# Patient Record
Sex: Male | Born: 1995
Health system: Southern US, Community
[De-identification: ages and names within clinical notes are randomized; demographics above are authoritative.]

## PROBLEM LIST (undated history)

## (undated) DIAGNOSIS — H6980 Other specified disorders of Eustachian tube, unspecified ear: Secondary | ICD-10-CM

## (undated) HISTORY — DX: Other specified disorders of Eustachian tube, unspecified ear: H69.80

---

## 2017-01-24 DIAGNOSIS — H0014 Chalazion left upper eyelid: Secondary | ICD-10-CM | POA: Diagnosis not present

## 2017-04-19 DIAGNOSIS — H01009 Unspecified blepharitis unspecified eye, unspecified eyelid: Secondary | ICD-10-CM | POA: Diagnosis not present

## 2017-04-19 DIAGNOSIS — H0014 Chalazion left upper eyelid: Secondary | ICD-10-CM | POA: Diagnosis not present

## 2017-05-08 DIAGNOSIS — H0014 Chalazion left upper eyelid: Secondary | ICD-10-CM | POA: Diagnosis not present

## 2017-05-08 DIAGNOSIS — H0011 Chalazion right upper eyelid: Secondary | ICD-10-CM | POA: Diagnosis not present

## 2017-05-08 MED FILL — NEO/POLY/DEXAMET EYE OINT: 3.5-10000-0 | 7 days supply | Qty: 4 | Fill #0

## 2017-07-04 ENCOUNTER — Encounter (HOSPITAL_COMMUNITY): Payer: Self-pay | Admitting: Emergency Medicine

## 2017-07-04 ENCOUNTER — Ambulatory Visit (HOSPITAL_COMMUNITY)
Admission: EM | Admit: 2017-07-04 | Discharge: 2017-07-04 | Disposition: A | Discharge status: Home / Self Care | Payer: 59 | Attending: Family Medicine | Admitting: Family Medicine

## 2017-07-04 DIAGNOSIS — J029 Acute pharyngitis, unspecified: Secondary | ICD-10-CM

## 2017-07-04 DIAGNOSIS — J039 Acute tonsillitis, unspecified: Secondary | ICD-10-CM | POA: Diagnosis not present

## 2017-07-04 DIAGNOSIS — B9689 Other specified bacterial agents as the cause of diseases classified elsewhere: Secondary | ICD-10-CM | POA: Diagnosis not present

## 2017-07-04 DIAGNOSIS — B951 Streptococcus, group B, as the cause of diseases classified elsewhere: Secondary | ICD-10-CM | POA: Insufficient documentation

## 2017-07-04 LAB — POCT INFECTIOUS MONO SCREEN: MONO SCREEN: NEGATIVE

## 2017-07-04 LAB — POCT RAPID STREP A: Streptococcus, Group A Screen (Direct): NEGATIVE

## 2017-07-04 MED ORDER — FLUTICASONE PROPIONATE 50 MCG/ACT NA SUSP: 2.0000 | Freq: Every day | NASAL | 0 refills | Status: DC

## 2017-07-04 MED ORDER — PENICILLIN V POTASSIUM 500 MG PO TABS: 500.0000 mg | ORAL_TABLET | Freq: Two times a day (BID) | ORAL | 0 refills | Status: AC

## 2017-07-04 MED ORDER — CETIRIZINE-PSEUDOEPHEDRINE ER 5-120 MG PO TB12: 1.0000 | ORAL_TABLET | Freq: Every day | ORAL | 0 refills | Status: DC

## 2017-07-04 MED ORDER — IBUPROFEN 800 MG PO TABS
ORAL_TABLET | ORAL | Status: AC
  Filled 2017-07-04: qty 1

## 2017-07-04 MED ORDER — IBUPROFEN 800 MG PO TABS
800.0000 mg | ORAL_TABLET | Freq: Once | ORAL | Status: AC
  Administered 2017-07-04: 800 mg via ORAL

## 2017-07-04 MED FILL — PENICILLIN VK 500 MG TABLET: 500 | 10 days supply | Qty: 20 | Fill #0

## 2017-07-04 MED FILL — FLUTICASONE PROP 50 MCG SPR: 50 | 30 days supply | Qty: 16 | Fill #0

## 2017-07-04 NOTE — ED Provider Notes (Signed)
[facility]    CSN: [id number] Arrival date & time: 07/04/17  1054     History   Chief Complaint Chief Complaint  Patient presents with  . Sore Throat    HPI Samuel Mora is a 21 y.o. male.   21 year old male with history of recurrent tonsillitis comes in for 5 day history of sore throat. Has also had left ear pain. Dysphagia without trouble breathing. Had some photophobia with head pressure. Intermittent dry cough. Nasal congestion with rhinorrhea. Subjective fever with chills. Has tried chloraseptic spray, tea, home remedies with some relief. Works in a dusty environment and made symptoms worse. Denies chest pain, shortness of breath. Denies abdominal pain, rashes, nausea, vomiting. Deneis sick conatct. Former smoker, quit last year, 4 years 1 cigar a day. Denies history of seasonal allergies, mono.       History reviewed. No pertinent past medical history.  There are no active problems to display for this patient.   History reviewed. No pertinent surgical history.     Home Medications    Prior to Admission medications   Medication Sig Start Date End Date Taking? Authorizing Provider  cetirizine-pseudoephedrine (ZYRTEC-D) 5-120 MG tablet Take 1 tablet by mouth daily. 07/04/17   Cathie Hoops, Dreyah Montrose V, PA-C  fluticasone (FLONASE) 50 MCG/ACT nasal spray Place 2 sprays into both nostrils daily. 07/04/17   Cathie Hoops, Akeema Broder V, PA-C  penicillin v potassium (VEETID) 500 MG tablet Take 1 tablet (500 mg total) by mouth 2 (two) times daily. 07/04/17 07/14/17  Belinda Fisher, PA-C    Family History No family history on file.  Social History Social History  Substance Use Topics  . Smoking status: Former Games developer  . Smokeless tobacco: Never Used  . Alcohol use Yes     Allergies   Patient has no known allergies.   Review of Systems Review of Systems  Reason unable to perform ROS: See HPI as above.     Physical Exam Triage Vital Signs ED Triage Vitals  Enc Vitals Group   BP 07/04/17 1124 125/84     Pulse Rate 07/04/17 1124 96     Resp 07/04/17 1124 16     Temp 07/04/17 1124 100.1 F (37.8 C)     Temp Source 07/04/17 1124 Oral     SpO2 07/04/17 1124 99 %     Weight 07/04/17 1122 120 lb (54.4 kg)     Height 07/04/17 1122 5\' 8"  (1.727 m)     Head Circumference --      Peak Flow --      Pain Score 07/04/17 1122 7     Pain Loc --      Pain Edu? --      Excl. in GC? --    No data found.   Updated Vital Signs BP 125/84   Pulse 96   Temp 100.1 F (37.8 C) (Oral)   Resp 16   Ht 5\' 8"  (1.727 m)   Wt 120 lb (54.4 kg)   SpO2 99%   BMI 18.25 kg/m   Physical Exam  Constitutional: He is oriented to person, place, and time. He appears well-developed and well-nourished. No distress.  HENT:  Head: Normocephalic and atraumatic.  Right Ear: External ear and ear canal normal. Tympanic membrane is erythematous. Tympanic membrane is not bulging.  Left Ear: External ear and ear canal normal. Tympanic membrane is erythematous. Tympanic membrane is not bulging.  Nose: Mucosal edema and rhinorrhea present. Right sinus exhibits no maxillary sinus  tenderness and no frontal sinus tenderness. Left sinus exhibits no maxillary sinus tenderness and no frontal sinus tenderness.  Mouth/Throat: Uvula is midline and mucous membranes are normal. Posterior oropharyngeal erythema present. Tonsils are 2+ on the right. Tonsils are 2+ on the left. Tonsillar exudate (bilateral).  Eyes: Pupils are equal, round, and reactive to light. Conjunctivae are normal.  Neck: Normal range of motion. Neck supple.  Cardiovascular: Normal rate, regular rhythm and normal heart sounds.  Exam reveals no gallop and no friction rub.   No murmur heard. Pulmonary/Chest: Effort normal and breath sounds normal. He has no decreased breath sounds. He has no wheezes. He has no rhonchi. He has no rales.  Lymphadenopathy:    He has no cervical adenopathy.  Neurological: He is alert and oriented to person,  place, and time.  Skin: Skin is warm and dry.  Psychiatric: He has a normal mood and affect. His behavior is normal. Judgment normal.     UC Treatments / Results  Labs (all labs ordered are listed, but only abnormal results are displayed) Labs Reviewed  CULTURE, GROUP A STREP [facility]  POCT RAPID STREP A  POCT INFECTIOUS MONO SCREEN    EKG  EKG Interpretation None       Radiology No results found.  Procedures Procedures (including critical care time)  Medications Ordered in UC Medications  ibuprofen (ADVIL,MOTRIN) tablet 800 mg (800 mg Oral Given 07/04/17 1130)     Initial Impression / Assessment and Plan / UC Course  I have reviewed the triage vital signs and the nursing notes.  Pertinent labs & imaging results that were available during my care of the patient were reviewed by me and considered in my medical decision making (see chart for details).    Rapid strep and mono negative. Given history and exam, will treat for tonsillitis. Start penicillin as directed. Symptomatic treatment as needed. Return precautions given.   Final Clinical Impressions(s) / UC Diagnoses   Final diagnoses:  Tonsillitis    New Prescriptions Discharge Medication List as of 07/04/2017 12:30 PM    START taking these medications   Details  cetirizine-pseudoephedrine (ZYRTEC-D) 5-120 MG tablet Take 1 tablet by mouth daily., Starting Wed 07/04/2017, Normal    fluticasone (FLONASE) 50 MCG/ACT nasal spray Place 2 sprays into both nostrils daily., Starting Wed 07/04/2017, Normal    penicillin v potassium (VEETID) 500 MG tablet Take 1 tablet (500 mg total) by mouth 2 (two) times daily., Starting Wed 07/04/2017, Until Sat 07/14/2017, Normal          Linward HeadlandYu, Atina Feeley V, PA-C 07/04/17 1322

## 2017-07-04 NOTE — ED Triage Notes (Signed)
PT reports sore throat that started Friday. PT reports recurrent tonsilitis. PT reports fever of 100.0 last night. Also reports left ear pain.

## 2017-07-04 NOTE — Discharge Instructions (Addendum)
Rapid strep negative. Will treat for tonsillitis given history and exam. Start penicillin as directed. Flonase and/or Zyrtec-D for nasal congestion. You can use over the counter nasal saline rinse such as neti pot for nasal congestion. Monitor for any worsening of symptoms, swelling of the throat, trouble breathing, trouble swallowing, follow up for reevaluation.  For sore throat try using a honey-based tea. Use 3 teaspoons of honey with juice squeezed from half lemon. Place shaved pieces of ginger into 1/2-1 cup of water and warm over stove top. Then mix the ingredients and repeat every 4 hours as needed.

## 2017-07-06 LAB — CULTURE, GROUP A STREP (THRC)

## 2017-07-30 DIAGNOSIS — H5213 Myopia, bilateral: Secondary | ICD-10-CM | POA: Diagnosis not present

## 2017-07-30 DIAGNOSIS — H52223 Regular astigmatism, bilateral: Secondary | ICD-10-CM | POA: Diagnosis not present

## 2017-08-01 ENCOUNTER — Telehealth: Payer: 59 | Admitting: Family

## 2017-08-01 DIAGNOSIS — J029 Acute pharyngitis, unspecified: Secondary | ICD-10-CM

## 2017-08-01 MED ORDER — FLUTICASONE PROPIONATE 50 MCG/ACT NA SUSP: 2.0000 | Freq: Every day | NASAL | 6 refills | Status: DC

## 2017-08-01 NOTE — Progress Notes (Signed)
We are sorry that you are not feeling well.  Here is how we plan to help!  Based on what you have shared with me it looks like you have acute pharyngitis.  This is an infection most likely caused by a virus  You may use an oral decongestant such as Mucinex D or if you have glaucoma or high blood pressure use plain Mucinex. Saline nasal spray help and can safely be used as often as needed for congestion, I have prescribed: Fluticasone nasal spray two sprays in each nostril twice a day   Providers prescribe antibiotics to treat infections caused by bacteria. Antibiotics are very powerful in treating bacterial infections when they are used properly. To maintain their effectiveness, they should be used only when necessary. Overuse of antibiotics has resulted in the development of superbugs that are resistant to treatment!    After careful review of your answers, I would not recommend an antibiotic for your condition.  Antibiotics are not effective against viruses and therefore should not be used to treat them. Common examples of infections caused by viruses include colds and flu    Some authorities believe that zinc sprays or the use of Echinacea may shorten the course of your symptoms.  Home Care:  Only take medications as instructed by your medical team.  Complete the entire course of an antibiotic.  Do not take these medications with alcohol.  A steam or ultrasonic humidifier can help congestion.  You can place a towel over your head and breathe in the steam from hot water coming from a faucet.  Avoid close contacts especially the very young and the elderly.  Cover your mouth when you cough or sneeze.  Always remember to wash your hands.  Get Help Right Away If:  You develop worsening fever or sinus pain.  You develop a severe head ache or visual changes.  Your symptoms persist after you have completed your treatment plan.  Make sure you  Understand these instructions.  Will  watch your condition.  Will get help right away if you are not doing well or get worse.  Your e-visit answers were reviewed by a board certified advanced clinical practitioner to complete your personal care plan.  Depending on the condition, your plan could have included both over the counter or prescription medications.  If there is a problem please reply  once you have received a response from your provider.  Your safety is important to us.  If you have drug allergies check your prescription carefully.    You can use MyChart to ask questions about today's visit, request a non-urgent call back, or ask for a work or school excuse for 24 hours related to this e-Visit. If it has been greater than 24 hours you will need to follow up with your provider, or enter a new e-Visit to address those concerns.  You will get an e-mail in the next two days asking about your experience.  I hope that your e-visit has been valuable and will speed your recovery. Thank you for using e-visits.

## 2018-01-31 ENCOUNTER — Encounter: Payer: Self-pay | Admitting: Urgent Care

## 2018-01-31 ENCOUNTER — Ambulatory Visit (INDEPENDENT_AMBULATORY_CARE_PROVIDER_SITE_OTHER): Payer: 59 | Admitting: Urgent Care

## 2018-01-31 ENCOUNTER — Ambulatory Visit (INDEPENDENT_AMBULATORY_CARE_PROVIDER_SITE_OTHER): Payer: 59

## 2018-01-31 VITALS — BP 106/68 | HR 98 | Temp 97.5°F | Resp 16 | Ht 67.5 in | Wt 119.0 lb

## 2018-01-31 DIAGNOSIS — M25552 Pain in left hip: Secondary | ICD-10-CM | POA: Diagnosis not present

## 2018-01-31 DIAGNOSIS — R070 Pain in throat: Secondary | ICD-10-CM | POA: Diagnosis not present

## 2018-01-31 DIAGNOSIS — H938X3 Other specified disorders of ear, bilateral: Secondary | ICD-10-CM

## 2018-01-31 DIAGNOSIS — M25551 Pain in right hip: Secondary | ICD-10-CM

## 2018-01-31 DIAGNOSIS — H6982 Other specified disorders of Eustachian tube, left ear: Secondary | ICD-10-CM

## 2018-01-31 MED ORDER — FLUTICASONE PROPIONATE 50 MCG/ACT NA SUSP: 2.0000 | Freq: Every day | NASAL | 11 refills | Status: AC

## 2018-01-31 MED ORDER — PSEUDOEPHEDRINE HCL ER 120 MG PO TB12: 120.0000 mg | ORAL_TABLET | Freq: Two times a day (BID) | ORAL | 3 refills | Status: AC

## 2018-01-31 MED ORDER — CETIRIZINE HCL 10 MG PO TABS: 10.0000 mg | ORAL_TABLET | Freq: Every day | ORAL | 11 refills | Status: AC

## 2018-01-31 MED FILL — FLUTICASONE PROP 50 MCG SPR: 50 | 30 days supply | Qty: 16 | Fill #0

## 2018-01-31 NOTE — Patient Instructions (Addendum)
Hydrate well with at least 2 liters (1 gallon) of water daily. Use Zyrtec and Flonase every day. Use Sudafed for the next 1-2 weeks 1-2 times daily as needed.    Eustachian Tube Dysfunction The eustachian tube connects the middle ear to the back of the nose. It regulates air pressure in the middle ear by allowing air to move between the ear and nose. It also helps to drain fluid from the middle ear space. When the eustachian tube does not function properly, air pressure, fluid, or both can build up in the middle ear. Eustachian tube dysfunction can affect one or both ears. What are the causes? This condition happens when the eustachian tube becomes blocked or cannot open normally. This may result from:  Ear infections.  Colds and other upper respiratory infections.  Allergies.  Irritation, such as from cigarette smoke or acid from the stomach coming up into the esophagus (gastroesophageal reflux).  Sudden changes in air pressure, such as from descending in an airplane.  Abnormal growths in the nose or throat, such as nasal polyps, tumors, or enlarged tissue at the back of the throat (adenoids).  What increases the risk? This condition may be more likely to develop in people who smoke and people who are overweight. Eustachian tube dysfunction may also be more likely to develop in children, especially children who have:  Certain birth defects of the mouth, such as cleft palate.  Large tonsils and adenoids.  What are the signs or symptoms? Symptoms of this condition may include:  A feeling of fullness in the ear.  Ear pain.  Clicking or popping noises in the ear.  Ringing in the ear.  Hearing loss.  Loss of balance.  Symptoms may get worse when the air pressure around you changes, such as when you travel to an area of high elevation or fly on an airplane. How is this diagnosed? This condition may be diagnosed based on:  Your symptoms.  A physical exam of your ear, nose,  and throat.  Tests, such as those that measure: ? The movement of your eardrum (tympanogram). ? Your hearing (audiometry).  How is this treated? Treatment depends on the cause and severity of your condition. If your symptoms are mild, you may be able to relieve your symptoms by moving air into ("popping") your ears. If you have symptoms of fluid in your ears, treatment may include:  Decongestants.  Antihistamines.  Nasal sprays or ear drops that contain medicines that reduce swelling (steroids).  In some cases, you may need to have a procedure to drain the fluid in your eardrum (myringotomy). In this procedure, a small tube is placed in the eardrum to:  Drain the fluid.  Restore the air in the middle ear space.  Follow these instructions at home:  Take over-the-counter and prescription medicines only as told by your health care provider.  Use techniques to help pop your ears as recommended by your health care provider. These may include: ? Chewing gum. ? Yawning. ? Frequent, forceful swallowing. ? Closing your mouth, holding your nose closed, and gently blowing as if you are trying to blow air out of your nose.  Do not do any of the following until your health care provider approves: ? Travel to high altitudes. ? Fly in airplanes. ? Work in a Estate agent or room. ? Scuba dive.  Keep your ears dry. Dry your ears completely after showering or bathing.  Do not smoke.  Keep all follow-up visits as told  by your health care provider. This is important. Contact a health care provider if:  Your symptoms do not go away after treatment.  Your symptoms come back after treatment.  You are unable to pop your ears.  You have: ? A fever. ? Pain in your ear. ? Pain in your head or neck. ? Fluid draining from your ear.  Your hearing suddenly changes.  You become very dizzy.  You lose your balance. This information is not intended to replace advice given to you by  your health care provider. Make sure you discuss any questions you have with your health care provider. Document Released: 09/24/2015 Document Revised: 02/03/2016 Document Reviewed: 09/16/2014 Elsevier Interactive Patient Education  2018 [employer].     Allergic Rhinitis, Adult Allergic rhinitis is an allergic reaction that affects the mucous membrane inside the nose. It causes sneezing, a runny or stuffy nose, and the feeling of mucus going down the back of the throat (postnasal drip). Allergic rhinitis can be mild to severe. There are two types of allergic rhinitis:  Seasonal. This type is also called hay fever. It happens only during certain seasons.  Perennial. This type can happen at any time of the year.  What are the causes? This condition happens when the body's defense system (immune system) responds to certain harmless substances called allergens as though they were germs.  Seasonal allergic rhinitis is triggered by pollen, which can come from grasses, trees, and weeds. Perennial allergic rhinitis may be caused by:  House dust mites.  Pet dander.  Mold spores.  What are the signs or symptoms? Symptoms of this condition include:  Sneezing.  Runny or stuffy nose (nasal congestion).  Postnasal drip.  Itchy nose.  Tearing of the eyes.  Trouble sleeping.  Daytime sleepiness.  How is this diagnosed? This condition may be diagnosed based on:  Your medical history.  A physical exam.  Tests to check for related conditions, such as: ? Asthma. ? Pink eye. ? Ear infection. ? Upper respiratory infection.  Tests to find out which allergens trigger your symptoms. These may include skin or blood tests.  How is this treated? There is no cure for this condition, but treatment can help control symptoms. Treatment may include:  Taking medicines that block allergy symptoms, such as antihistamines. Medicine may be given as a shot, nasal spray, or pill.  Avoiding  the allergen.  Desensitization. This treatment involves getting ongoing shots until your body becomes less sensitive to the allergen. This treatment may be done if other treatments do not help.  If taking medicine and avoiding the allergen does not work, new, stronger medicines may be prescribed.  Follow these instructions at home:  Find out what you are allergic to. Common allergens include smoke, dust, and pollen.  Avoid the things you are allergic to. These are some things you can do to help avoid allergens: ? Replace carpet with wood, tile, or vinyl flooring. Carpet can trap dander and dust. ? Do not smoke. Do not allow smoking in your home. ? Change your heating and air conditioning filter at least once a month. ? During allergy season:  Keep windows closed as much as possible.  Plan outdoor activities when pollen counts are lowest. This is usually during the evening hours.  When coming indoors, change clothing and shower before sitting on furniture or bedding.  Take over-the-counter and prescription medicines only as told by your health care provider.  Keep all follow-up visits as told by your  health care provider. This is important. Contact a health care provider if:  You have a fever.  You develop a persistent cough.  You make whistling sounds when you breathe (you wheeze).  Your symptoms interfere with your normal daily activities. Get help right away if:  You have shortness of breath. Summary  This condition can be managed by taking medicines as directed and avoiding allergens.  Contact your health care provider if you develop a persistent cough or fever.  During allergy season, keep windows closed as much as possible. This information is not intended to replace advice given to you by your health care provider. Make sure you discuss any questions you have with your health care provider. Document Released: 05/23/2001 Document Revised: 10/05/2016 Document  Reviewed: 10/05/2016 Elsevier Interactive Patient Education  2018 [employer].     IF you received an x-ray today, you will receive an invoice from [facility]. Please contact [facility] at [phone] with questions or concerns regarding your invoice.   IF you received labwork today, you will receive an invoice from [facility]. Please contact [facility] at [phone] with questions or concerns regarding your invoice.   Our billing staff will not be able to assist you with questions regarding bills from these companies.  You will be contacted with the lab results as soon as they are available. The fastest way to get your results is to activate your My Chart account. Instructions are located on the last page of this paperwork. If you have not heard from [place] regarding the results in 2 weeks, please contact this office.

## 2018-01-31 NOTE — Progress Notes (Signed)
   MRN: [id number] DOB: 01-21-1996  Subjective:   Samuel Mora is a 22 y.o. male presenting for 1 month history of left ear popping. Has had occasional ringing of his left ear, discomfort/fullness. Had associated itchy throat, neck discomfort. 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 Denies fever, drainage, throat pain, cough. Drinks 3-4 bottles of water daily. Reports popping of both hips since his teenage years. Denies hip pain, trauma, falls.  Patient does walk frequently for physical activity. Patient smokes Black & Milds multiple times a week.   Samuel Mora is not currently taking any medications.  Also has No Known Allergies.  Samuel Mora denies past medical and surgical history.   Objective:   Vitals: BP 106/68   Pulse 98   Temp (!) 97.5 F (36.4 C) (Oral)   Resp 16   Ht 5' 7.5" (1.715 m)   Wt 119 lb (54 kg)   SpO2 100%   BMI 18.36 kg/m   Physical Exam  Constitutional: He is oriented to person, place, and time. He appears well-developed and well-nourished.  HENT:  Left TM with slight effusion, right TM is clear without erythema.  Patient has mucosal edema with violaceous appearance.  Throat with postnasal drainage and cobblestone pattern.  Eyes: Right eye exhibits no discharge. Left eye exhibits no discharge. No scleral icterus.  Neck: Normal range of motion. Neck supple.  Cardiovascular: Normal rate, regular rhythm and intact distal pulses. Exam reveals no gallop and no friction rub.  No murmur heard. Pulmonary/Chest: No respiratory distress. He has no wheezes. He has no rales.  Lymphadenopathy:    He has no cervical adenopathy.  Neurological: He is alert and oriented to person, place, and time.  Skin: Skin is warm and dry.  Psychiatric: He has a normal mood and affect.    Dg Hips Bilat W Or W/o Pelvis 2v  Result Date: 01/31/2018 CLINICAL DATA:  Bilateral hip popping.  No known injury. EXAM: DG HIP (WITH OR WITHOUT PELVIS) 2V BILAT  COMPARISON:  None. FINDINGS: There is no evidence of hip fracture or dislocation. There is no evidence of arthropathy or other focal bone abnormality. IMPRESSION: Negative exam. Electronically Signed   By: Drusilla Kanner M.D.   On: 01/31/2018 14:58     Assessment and Plan :   Eustachian tube dysfunction, left  Hip discomfort, left - Plan: DG HIPS BILAT W OR W/O PELVIS 2V  Hip discomfort, right - Plan: DG HIPS BILAT W OR W/O PELVIS 2V  Ear popping, bilateral  Throat discomfort  Counseled on management of allergic rhinitis, ETD.  Patient has a history of allergies and was previously taking Flonase and Zyrtec-D.  I will have patient restart this regimen.  Return to clinic precautions discussed.  Reassured patient with normal hip x-ray.  Continue to monitor symptoms.  Wallis Bamberg, PA-C Primary Care at [facility] [phone] 01/31/2018  2:09 PM

## 2018-03-01 ENCOUNTER — Other Ambulatory Visit: Payer: Self-pay

## 2018-03-01 ENCOUNTER — Encounter: Payer: Self-pay | Admitting: Urgent Care

## 2018-03-01 ENCOUNTER — Ambulatory Visit (INDEPENDENT_AMBULATORY_CARE_PROVIDER_SITE_OTHER): Payer: 59 | Admitting: Urgent Care

## 2018-03-01 VITALS — BP 112/56 | HR 56 | Temp 97.7°F | Resp 16 | Ht 67.5 in | Wt 119.0 lb

## 2018-03-01 DIAGNOSIS — H938X3 Other specified disorders of ear, bilateral: Secondary | ICD-10-CM

## 2018-03-01 DIAGNOSIS — H6982 Other specified disorders of Eustachian tube, left ear: Secondary | ICD-10-CM | POA: Diagnosis not present

## 2018-03-01 NOTE — Progress Notes (Signed)
    MRN: [id number] DOB: 07/15/1996  Subjective:   Samuel Mora is a 22 y.o. male presenting for follow-up on eustachian tube dysfunction.  His last office visit was on 01/31/2018.  At that point patient was experiencing ear fullness, ear popping.  He was started on Zyrtec, Flonase and Sudafed.  Today, he reports dramatic improvement for management of eustachian tube dysfunction secondary to allergies from his frequent walking outdoors.  Denies fever, ear drainage, ear pain.  He is not using the allergy medications daily anymore but notes significant improvement when he gets them back on board.  Samuel Mora has a current medication list which includes the following prescription(s): cetirizine, fluticasone, and pseudoephedrine. Also has No Known Allergies.  Samuel Mora  has a past medical history of Eustachian tube dysfunction.  Denies past surgical history.  Objective:   Vitals: BP (!) 112/56   Pulse (!) 56   Temp 97.7 F (36.5 C) (Oral)   Resp 16   Ht 5' 7.5" (1.715 m)   Wt 119 lb (54 kg)   SpO2 97%   BMI 18.36 kg/m   Physical Exam  Constitutional: He is oriented to person, place, and time. He appears well-developed and well-nourished.  HENT:  Right Ear: Tympanic membrane normal.  Left Ear: Tympanic membrane normal.  Nose: No sinus tenderness.  Mouth/Throat: Oropharynx is clear and moist.  Cardiovascular: Normal rate.  Pulmonary/Chest: Effort normal.  Neurological: He is alert and oriented to person, place, and time.   Assessment and Plan :   Ear popping, bilateral  Eustachian tube dysfunction, left  Counseled on management of ETD.  Patient will follow-up as needed.  Wallis BambergMario Kerrion Kemppainen, PA-C [facility] at [facility] [phone] 03/01/2018  2:47 PM

## 2018-03-01 NOTE — Patient Instructions (Addendum)

## 2018-04-29 ENCOUNTER — Ambulatory Visit (INDEPENDENT_AMBULATORY_CARE_PROVIDER_SITE_OTHER): Payer: 59 | Admitting: Emergency Medicine

## 2018-04-29 ENCOUNTER — Encounter: Payer: Self-pay | Admitting: Emergency Medicine

## 2018-04-29 VITALS — BP 105/64 | HR 91 | Temp 98.1°F | Resp 16 | Ht 67.0 in | Wt 115.0 lb

## 2018-04-29 DIAGNOSIS — H0014 Chalazion left upper eyelid: Secondary | ICD-10-CM

## 2018-04-29 MED ORDER — CEPHALEXIN 500 MG PO CAPS: 500.0000 mg | ORAL_CAPSULE | Freq: Three times a day (TID) | ORAL | 0 refills | Status: AC

## 2018-04-29 MED ORDER — ERYTHROMYCIN 5 MG/GM OP OINT: 1.0000 "application " | TOPICAL_OINTMENT | Freq: Two times a day (BID) | OPHTHALMIC | 1 refills | Status: AC

## 2018-04-29 NOTE — Progress Notes (Signed)
Samuel Mora 22 y.o.   Chief Complaint  Patient presents with  . Eye Pain    left/ x 1wk, discharge from eye  . Ear Pain    left/ pt wanted to get it checked, feels ok    HISTORY OF PRESENT ILLNESS: This is a 22 y.o. male complaining of swelling and redness to left upper eyelid with slight discharge for 1 week.  No other significant symptoms  HPI   Prior to Admission medications   Medication Sig Start Date End Date Taking? Authorizing Provider  cetirizine (ZYRTEC) 10 MG tablet Take 1 tablet (10 mg total) by mouth daily. 01/31/18  Yes Wallis BambergMani, Mario, PA-C  fluticasone (FLONASE) 50 MCG/ACT nasal spray Place 2 sprays into both nostrils daily. 01/31/18  Yes Wallis BambergMani, Mario, PA-C  pseudoephedrine (SUDAFED 12 HOUR) 120 MG 12 hr tablet Take 1 tablet (120 mg total) by mouth 2 (two) times daily. 01/31/18  Yes Wallis BambergMani, Mario, PA-C    No Known Allergies  There are no active problems to display for this patient.   Past Medical History:  Diagnosis Date  . Eustachian tube dysfunction     No past surgical history on file.  Social History   Socioeconomic History  . Marital status: Single    Spouse name: Not on file  . Number of children: Not on file  . Years of education: Not on file  . Highest education level: Not on file  Occupational History  . Not on file  Social Needs  . Financial resource strain: Not on file  . Food insecurity:    Worry: Not on file    Inability: Not on file  . Transportation needs:    Medical: Not on file    Non-medical: Not on file  Tobacco Use  . Smoking status: Former Games developermoker  . Smokeless tobacco: Never Used  Substance and Sexual Activity  . Alcohol use: Yes  . Drug use: No  . Sexual activity: Not on file  Lifestyle  . Physical activity:    Days per week: Not on file    Minutes per session: Not on file  . Stress: Not on file  Relationships  . Social connections:    Talks on phone: Not on file    Gets together: Not on file    Attends religious  service: Not on file    Active member of club or organization: Not on file    Attends meetings of clubs or organizations: Not on file    Relationship status: Not on file  . Intimate partner violence:    Fear of current or ex partner: Not on file    Emotionally abused: Not on file    Physically abused: Not on file    Forced sexual activity: Not on file  Other Topics Concern  . Not on file  Social History Narrative  . Not on file    No family history on file.   Review of Systems  Constitutional: Negative.  Negative for chills and fever.  HENT: Negative.  Negative for sore throat.   Eyes: Positive for discharge and redness. Negative for blurred vision, double vision and pain.  Respiratory: Negative.  Negative for cough and shortness of breath.   Cardiovascular: Negative.  Negative for chest pain and palpitations.  Gastrointestinal: Negative.  Negative for nausea and vomiting.  Genitourinary: Negative.   Skin: Negative.  Negative for rash.  Neurological: Negative.  Negative for dizziness and headaches.  Endo/Heme/Allergies: Negative.   All other systems reviewed and  are negative.   Vitals:   04/29/18 1413  BP: 105/64  Pulse: 91  Resp: 16  Temp: 98.1 F (36.7 C)  SpO2: 97%    Physical Exam  Constitutional: He is oriented to person, place, and time. He appears well-developed and well-nourished.  HENT:  Head: Normocephalic and atraumatic.  Eyes: Pupils are equal, round, and reactive to light. Conjunctivae and EOM are normal.  Left upper eyelid shows a chalazion with swelling and erythema.  Neck: Normal range of motion. Neck supple.  Cardiovascular: Normal rate and regular rhythm.  Pulmonary/Chest: Effort normal and breath sounds normal.  Musculoskeletal: Normal range of motion. He exhibits no edema.  Neurological: He is alert and oriented to person, place, and time. No sensory deficit. He exhibits normal muscle tone.  Skin: Skin is warm and dry. Capillary refill takes  less than 2 seconds. No rash noted.  Psychiatric: He has a normal mood and affect. His behavior is normal.  Vitals reviewed.    ASSESSMENT & PLAN: [name] was seen today for eye pain and ear pain.  Diagnoses and all orders for this visit:  Chalazion of left upper eyelid -     erythromycin ophthalmic ointment; Place 1 application into the left eye 2 (two) times daily for 5 days. -     cephALEXin (KEFLEX) 500 MG capsule; Take 1 capsule (500 mg total) by mouth 3 (three) times daily for 7 days.   Patient Instructions       If you have lab work done today you will be contacted with your lab results within the next 2 weeks.  If you have not heard from [place] then please contact us. The fastest way to get your results is to register for My Chart.   IF you received an x-ray today, you will receive an invoice from [facility]. Please contact [facility] at [phone] with questions or concerns regarding your invoice.   IF you received labwork today, you will receive an invoice from [facility]. Please contact [facility] at [phone] with questions or concerns regarding your invoice.   Our billing staff will not be able to assist you with questions regarding bills from these companies.  You will be contacted with the lab results as soon as they are available. The fastest way to get your results is to activate your My Chart account. Instructions are located on the last page of this paperwork. If you have not heard from [place] regarding the results in 2 weeks, please contact this office.     Chalazion A chalazion is a swelling or lump on the eyelid. It can affect the upper or lower eyelid. What are the causes? This condition may be caused by:  Long-lasting (chronic) inflammation of the eyelid glands.  A blocked oil gland in the eyelid.  What are the signs or symptoms? Symptoms of this condition include:  A swelling on the eyelid. The swelling may spread to areas around  the eye.  A hard lump on the eyelid. This lump may make it hard to see out of the eye.  How is this diagnosed? This condition is diagnosed with an examination of the eye. How is this treated? This condition is treated by applying a warm compress to the eyelid. If the condition does not improve after two days, it may be treated with:  Surgery.  Medicine that is injected into the chalazion by a health care provider.  Medicine that is applied to the eye.  Follow these instructions at home:  Do not touch the chalazion.  Do not try to remove the pus, such as by squeezing the chalazion or sticking it with a pin or needle.  Do not rub your eyes.  Wash your hands often. Dry your hands with a clean towel.  Keep your face, scalp, and eyebrows clean.  Avoid wearing eye makeup.  Apply a warm, moist compress to the eyelid 4-6 times a day for 10-15 minutes at a time. This will help to open any blocked glands and help to reduce redness and swelling.  Apply over-the-counter and prescription medicines only as told by your health care provider.  If the chalazion does not break open (rupture) on its own in a month, return to your health care provider.  Keep all follow-up appointments as told by your health care provider. This is important. Contact a health care provider if:  Your eyelid has not improved in 4 weeks.  Your eyelid is getting worse.  You have a fever.  The chalazion does not rupture on its own with home treatment in a month. Get help right away if:  You have pain in your eye.  Your vision changes.  The chalazion becomes painful or red  The chalazion gets bigger. This information is not intended to replace advice given to you by your health care provider. Make sure you discuss any questions you have with your health care provider. Document Released: 08/25/2000 Document Revised: 02/03/2016 Document Reviewed: 12/21/2014 Elsevier Interactive Patient Education  2018  [employer]      Edwina BarthMiguel Jeffrie Lofstrom, MD [facility]

## 2018-04-29 NOTE — Patient Instructions (Addendum)
   If you have lab work done today you will be contacted with your lab results within the next 2 weeks.  If you have not heard from us then please contact us. The fastest way to get your results is to register for My Chart.   IF you received an x-ray today, you will receive an invoice from [facility]. Please contact [facility] at [phone] with questions or concerns regarding your invoice.   IF you received labwork today, you will receive an invoice from [facility]. Please contact [facility] at [phone] with questions or concerns regarding your invoice.   Our billing staff will not be able to assist you with questions regarding bills from these companies.  You will be contacted with the lab results as soon as they are available. The fastest way to get your results is to activate your My Chart account. Instructions are located on the last page of this paperwork. If you have not heard from us regarding the results in 2 weeks, please contact this office.    Chalazion A chalazion is a swelling or lump on the eyelid. It can affect the upper or lower eyelid. What are the causes? This condition may be caused by:  Long-lasting (chronic) inflammation of the eyelid glands.  A blocked oil gland in the eyelid.  What are the signs or symptoms? Symptoms of this condition include:  A swelling on the eyelid. The swelling may spread to areas around the eye.  A hard lump on the eyelid. This lump may make it hard to see out of the eye.  How is this diagnosed? This condition is diagnosed with an examination of the eye. How is this treated? This condition is treated by applying a warm compress to the eyelid. If the condition does not improve after two days, it may be treated with:  Surgery.  Medicine that is injected into the chalazion by a health care provider.  Medicine that is applied to the eye.  Follow these instructions at home:  Do not touch the chalazion.  Do  not try to remove the pus, such as by squeezing the chalazion or sticking it with a pin or needle.  Do not rub your eyes.  Wash your hands often. Dry your hands with a clean towel.  Keep your face, scalp, and eyebrows clean.  Avoid wearing eye makeup.  Apply a warm, moist compress to the eyelid 4-6 times a day for 10-15 minutes at a time. This will help to open any blocked glands and help to reduce redness and swelling.  Apply over-the-counter and prescription medicines only as told by your health care provider.  If the chalazion does not break open (rupture) on its own in a month, return to your health care provider.  Keep all follow-up appointments as told by your health care provider. This is important. Contact a health care provider if:  Your eyelid has not improved in 4 weeks.  Your eyelid is getting worse.  You have a fever.  The chalazion does not rupture on its own with home treatment in a month. Get help right away if:  You have pain in your eye.  Your vision changes.  The chalazion becomes painful or red  The chalazion gets bigger. This information is not intended to replace advice given to you by your health care provider. Make sure you discuss any questions you have with your health care provider. Document Released: 08/25/2000 Document Revised: 02/03/2016 Document Reviewed: 12/21/2014 Elsevier Interactive Patient   Education  2018 [employer] Inc.  

## 2018-06-14 MED FILL — FLUTICASONE PROP 50 MCG SPR: 50 | 30 days supply | Qty: 16 | Fill #1

## 2018-06-14 MED FILL — ERYTHROMYCIN 0.5% EYE OINT: 5 | 5 days supply | Qty: 4 | Fill #0

## 2019-05-07 IMAGING — DX DG HIP (WITH OR WITHOUT PELVIS) 2V BILAT
5 series · 5 of 5 positions shown · non-contrast
Comparison: None.

CLINICAL DATA: Bilateral hip popping.  No known injury.

EXAM:
DG HIP (WITH OR WITHOUT PELVIS) 2V BILAT

[hip ap (1 of 2)]
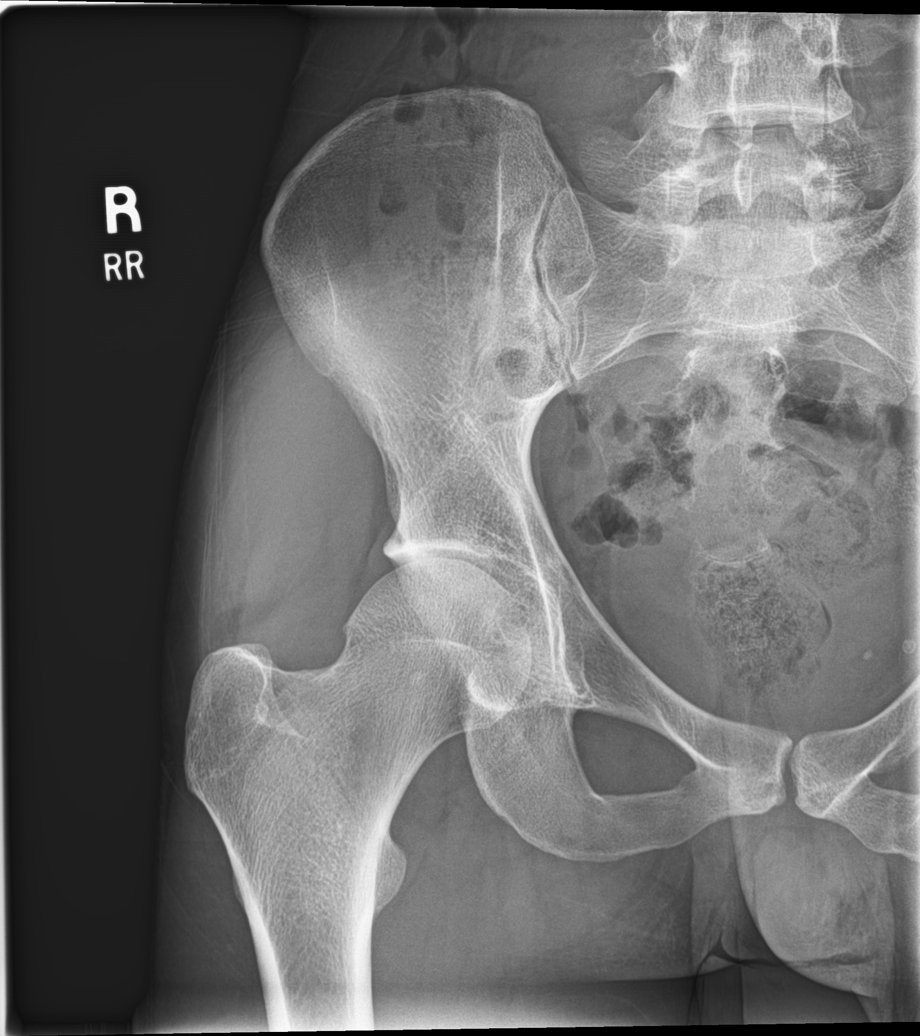

[hip lat (1 of 2)]
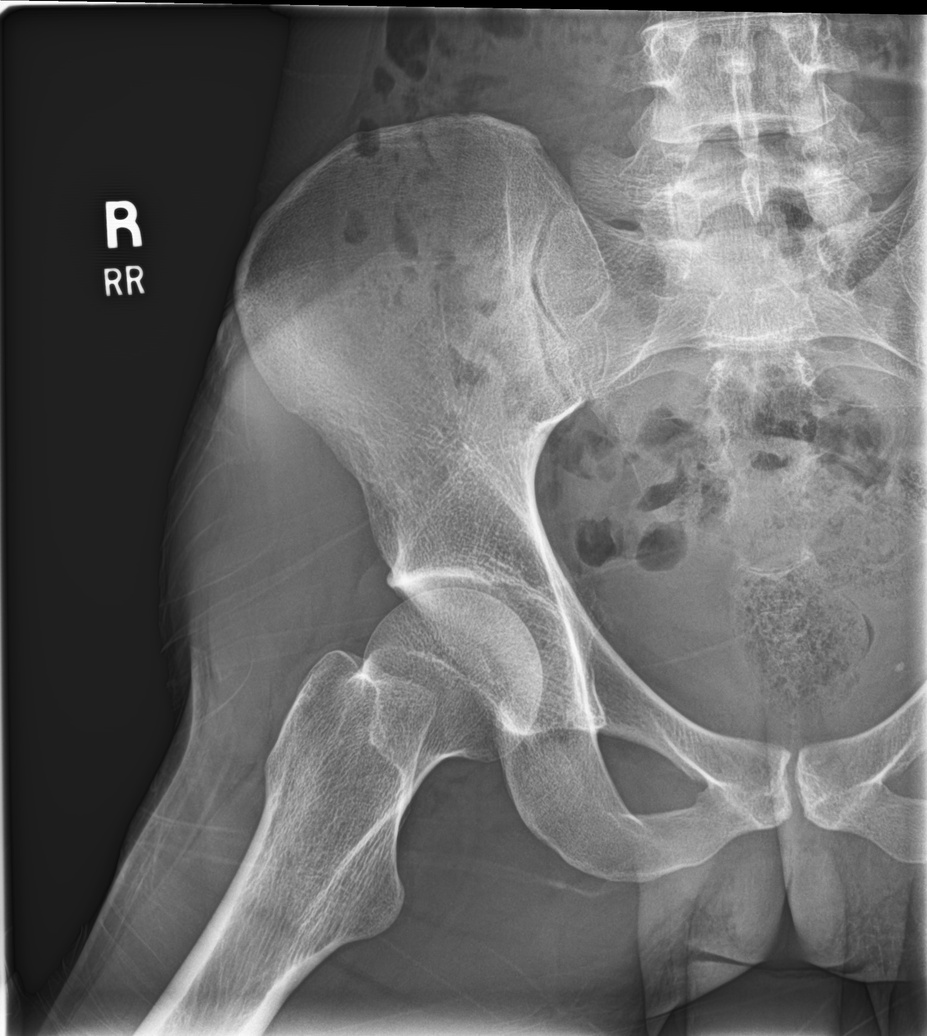

[hip ap (2 of 2)]
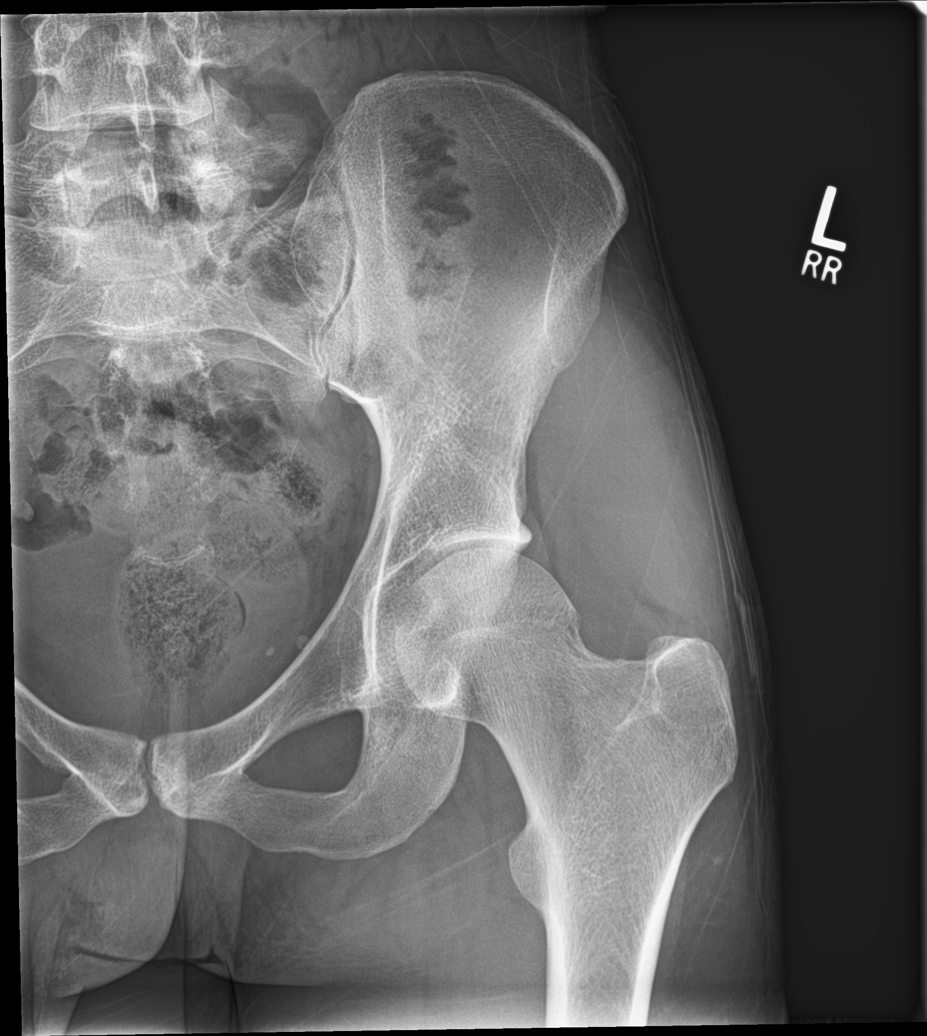

[hip lat (2 of 2)]
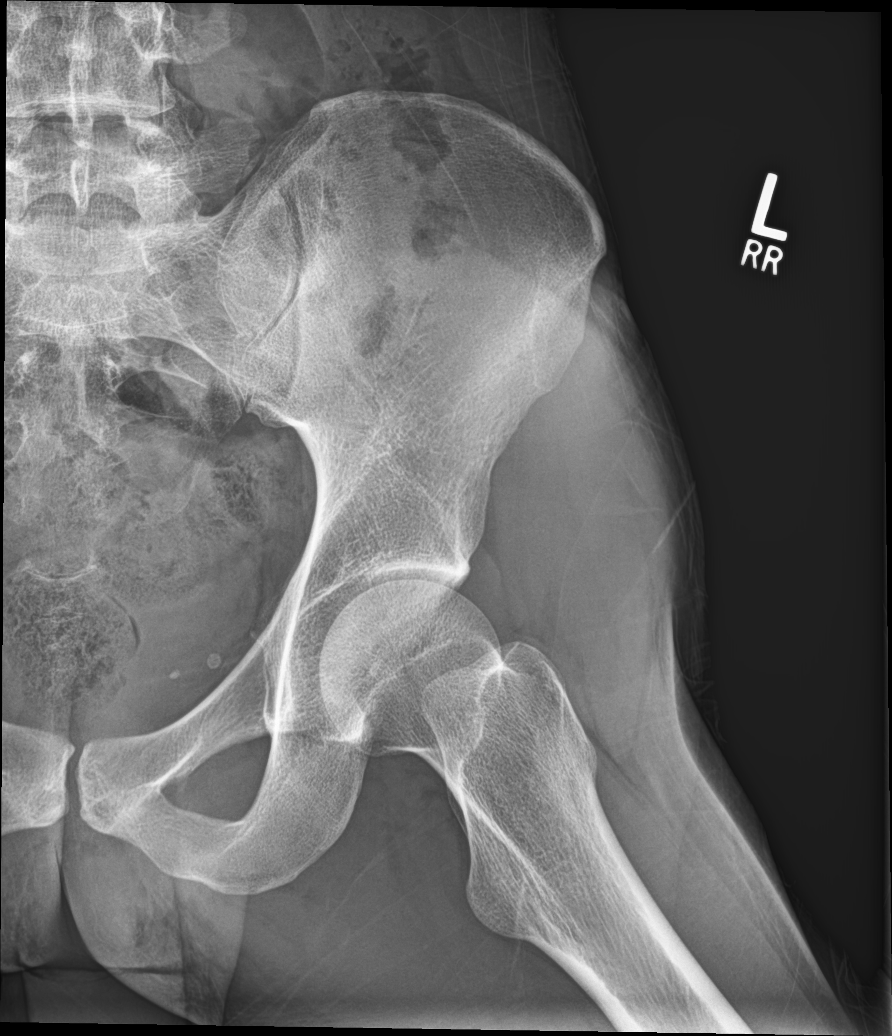

[pelvis ap]
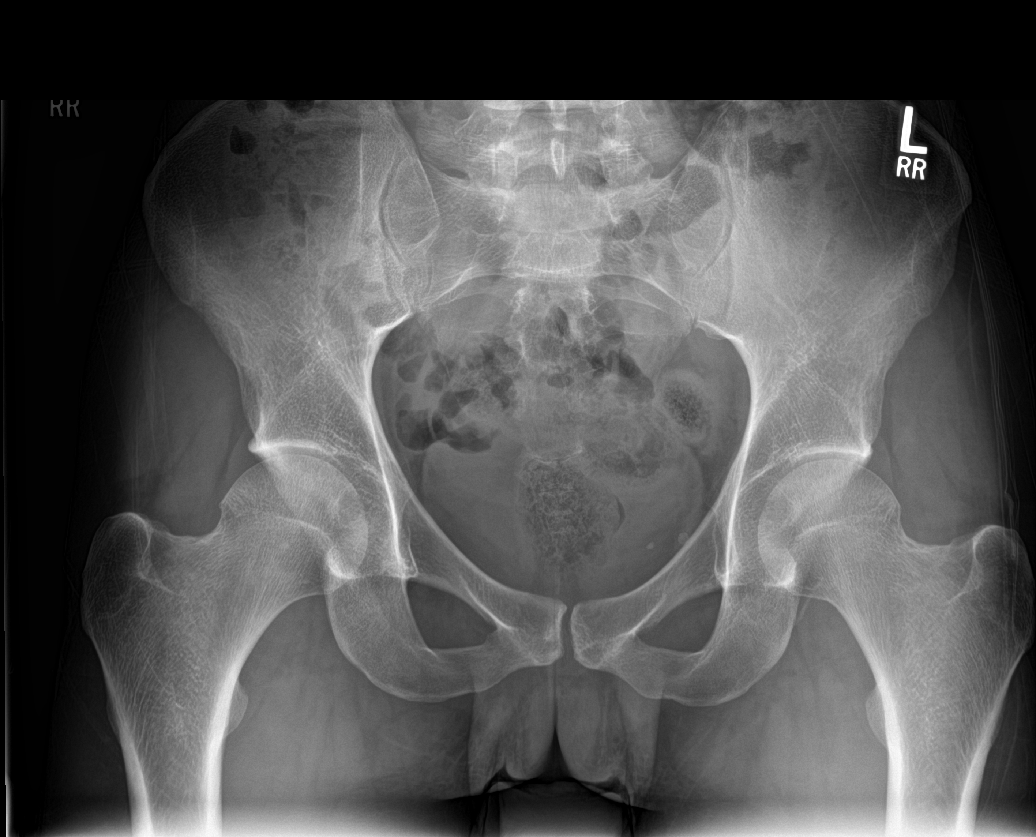

[5 of 5 positions shown; findings below may reference images not displayed]

FINDINGS: There is no evidence of hip fracture or dislocation. There is no
evidence of arthropathy or other focal bone abnormality.
IMPRESSION: Negative exam.
